# Patient Record
Sex: Female | Born: 1975 | Hispanic: No | Marital: Married | Smoking: Former smoker
Health system: Southern US, Community
[De-identification: ages and names within clinical notes are randomized; demographics above are authoritative.]

## PROBLEM LIST (undated history)

## (undated) DIAGNOSIS — E669 Obesity, unspecified: Secondary | ICD-10-CM

## (undated) DIAGNOSIS — Z9989 Dependence on other enabling machines and devices: Secondary | ICD-10-CM

## (undated) DIAGNOSIS — R7301 Impaired fasting glucose: Secondary | ICD-10-CM

## (undated) DIAGNOSIS — E119 Type 2 diabetes mellitus without complications: Secondary | ICD-10-CM

## (undated) DIAGNOSIS — F419 Anxiety disorder, unspecified: Secondary | ICD-10-CM

## (undated) DIAGNOSIS — E785 Hyperlipidemia, unspecified: Secondary | ICD-10-CM

## (undated) DIAGNOSIS — G4733 Obstructive sleep apnea (adult) (pediatric): Secondary | ICD-10-CM

## (undated) DIAGNOSIS — F329 Major depressive disorder, single episode, unspecified: Secondary | ICD-10-CM

## (undated) DIAGNOSIS — F32A Depression, unspecified: Secondary | ICD-10-CM

## (undated) DIAGNOSIS — G43909 Migraine, unspecified, not intractable, without status migrainosus: Secondary | ICD-10-CM

## (undated) DIAGNOSIS — J302 Other seasonal allergic rhinitis: Secondary | ICD-10-CM

## (undated) HISTORY — DX: Anxiety disorder, unspecified: F41.9

## (undated) HISTORY — DX: Other seasonal allergic rhinitis: J30.2

## (undated) HISTORY — DX: Obesity, unspecified: E66.9

## (undated) HISTORY — DX: Migraine, unspecified, not intractable, without status migrainosus: G43.909

## (undated) HISTORY — DX: Depression, unspecified: F32.A

## (undated) HISTORY — DX: Dependence on other enabling machines and devices: Z99.89

## (undated) HISTORY — DX: Obstructive sleep apnea (adult) (pediatric): G47.33

## (undated) HISTORY — DX: Major depressive disorder, single episode, unspecified: F32.9

## (undated) HISTORY — DX: Hyperlipidemia, unspecified: E78.5

## (undated) HISTORY — DX: Impaired fasting glucose: R73.01

---

## 2004-12-22 ENCOUNTER — Other Ambulatory Visit: Admission: RE | Admit: 2004-12-22 | Discharge: 2004-12-22 | Payer: Self-pay | Admitting: Family Medicine

## 2005-10-02 ENCOUNTER — Other Ambulatory Visit: Admission: RE | Admit: 2005-10-02 | Discharge: 2005-10-02 | Payer: Self-pay | Admitting: Family Medicine

## 2012-05-11 ENCOUNTER — Other Ambulatory Visit (HOSPITAL_COMMUNITY)
Admission: RE | Admit: 2012-05-11 | Discharge: 2012-05-11 | Disposition: A | Discharge status: Home / Self Care | Payer: Self-pay | Source: Ambulatory Visit | Attending: Family Medicine | Admitting: Family Medicine

## 2012-05-11 ENCOUNTER — Other Ambulatory Visit: Payer: Self-pay | Admitting: Family Medicine

## 2012-05-11 DIAGNOSIS — Z1159 Encounter for screening for other viral diseases: Secondary | ICD-10-CM | POA: Insufficient documentation

## 2012-05-11 DIAGNOSIS — Z124 Encounter for screening for malignant neoplasm of cervix: Secondary | ICD-10-CM | POA: Insufficient documentation

## 2014-10-18 ENCOUNTER — Ambulatory Visit: Payer: Self-pay

## 2014-10-23 ENCOUNTER — Other Ambulatory Visit (HOSPITAL_COMMUNITY)
Admission: RE | Admit: 2014-10-23 | Discharge: 2014-10-23 | Disposition: A | Discharge status: Home / Self Care | Payer: BC Managed Care – PPO | Source: Ambulatory Visit | Attending: Family Medicine | Admitting: Family Medicine

## 2014-10-23 ENCOUNTER — Other Ambulatory Visit: Payer: Self-pay | Admitting: Family Medicine

## 2014-10-23 DIAGNOSIS — Z1151 Encounter for screening for human papillomavirus (HPV): Secondary | ICD-10-CM | POA: Insufficient documentation

## 2014-10-23 DIAGNOSIS — Z124 Encounter for screening for malignant neoplasm of cervix: Secondary | ICD-10-CM | POA: Diagnosis not present

## 2014-10-25 ENCOUNTER — Ambulatory Visit: Payer: BC Managed Care – PPO

## 2014-10-25 LAB — CYTOLOGY - PAP

## 2014-11-01 ENCOUNTER — Ambulatory Visit: Payer: BC Managed Care – PPO

## 2015-02-11 ENCOUNTER — Encounter: Payer: Self-pay | Admitting: *Deleted

## 2017-12-15 ENCOUNTER — Other Ambulatory Visit: Payer: Self-pay | Admitting: Family Medicine

## 2017-12-15 ENCOUNTER — Other Ambulatory Visit (HOSPITAL_COMMUNITY)
Admission: RE | Admit: 2017-12-15 | Discharge: 2017-12-15 | Disposition: A | Discharge status: Home / Self Care | Payer: 59 | Source: Ambulatory Visit | Attending: Family Medicine | Admitting: Family Medicine

## 2017-12-15 DIAGNOSIS — Z124 Encounter for screening for malignant neoplasm of cervix: Secondary | ICD-10-CM | POA: Diagnosis not present

## 2017-12-16 LAB — CYTOLOGY - PAP
Diagnosis: NEGATIVE
HPV: NOT DETECTED

## 2019-09-21 ENCOUNTER — Other Ambulatory Visit: Payer: Self-pay

## 2019-09-21 DIAGNOSIS — Z20822 Contact with and (suspected) exposure to covid-19: Secondary | ICD-10-CM

## 2019-09-22 LAB — NOVEL CORONAVIRUS, NAA: SARS-CoV-2, NAA: NOT DETECTED

## 2020-01-23 ENCOUNTER — Other Ambulatory Visit: Payer: Self-pay | Admitting: Family Medicine

## 2020-01-23 DIAGNOSIS — Z1231 Encounter for screening mammogram for malignant neoplasm of breast: Secondary | ICD-10-CM

## 2020-01-25 ENCOUNTER — Other Ambulatory Visit: Payer: Self-pay

## 2020-01-25 ENCOUNTER — Ambulatory Visit
Admission: RE | Admit: 2020-01-25 | Discharge: 2020-01-25 | Disposition: A | Discharge status: Home / Self Care | Payer: 59 | Source: Ambulatory Visit

## 2020-01-25 DIAGNOSIS — Z1231 Encounter for screening mammogram for malignant neoplasm of breast: Secondary | ICD-10-CM

## 2020-02-17 ENCOUNTER — Ambulatory Visit: Payer: 59 | Attending: Internal Medicine

## 2020-02-17 DIAGNOSIS — Z23 Encounter for immunization: Secondary | ICD-10-CM | POA: Insufficient documentation

## 2020-02-17 NOTE — Progress Notes (Signed)
   Covid-19 Vaccination Clinic  Name:  Ana Scott    MRN: [id number] DOB: 1976-10-20  02/17/2020  [name] was observed post Covid-19 immunization for 15 minutes without incident. She was provided with Vaccine Information Sheet and instruction to access the V-Safe system.   [name] was instructed to call 911 with any severe reactions post vaccine: Marland Kitchen Difficulty breathing  . Swelling of face and throat  . A fast heartbeat  . A bad rash all over body  . Dizziness and weakness   Immunizations Administered    Name Date Dose VIS Date Route   Pfizer COVID-19 Vaccine 02/17/2020 12:22 PM 0.3 mL 11/24/2019 Intramuscular   Manufacturer: [employer], [employer]   Lot: [id number]   NDC: [address]-1

## 2020-03-09 ENCOUNTER — Ambulatory Visit: Payer: 59 | Attending: Internal Medicine

## 2020-03-09 DIAGNOSIS — Z23 Encounter for immunization: Secondary | ICD-10-CM

## 2020-03-09 NOTE — Progress Notes (Signed)
   Covid-19 Vaccination Clinic  Name:  Ana Scott    MRN: [id number] DOB: [birth year]  03/09/2020  Ana Scott was observed post Covid-19 immunization for 15 minutes without incident. She was provided with Vaccine Information Sheet and instruction to access the V-Safe system.   Ana Scott was instructed to call 911 with any severe reactions post vaccine: Marland Kitchen Difficulty breathing  . Swelling of face and throat  . A fast heartbeat  . A bad rash all over body  . Dizziness and weakness   Immunizations Administered    Name Date Dose VIS Date Route   Pfizer COVID-19 Vaccine 03/09/2020 12:14 PM 0.3 mL 11/24/2019 Intramuscular   Manufacturer: [employer], [employer]   Lot: [id number]   NDC: [address]-8

## 2021-03-21 ENCOUNTER — Encounter (HOSPITAL_COMMUNITY): Payer: Self-pay | Admitting: Emergency Medicine

## 2021-03-21 ENCOUNTER — Emergency Department (HOSPITAL_COMMUNITY)
Admission: EM | Admit: 2021-03-21 | Discharge: 2021-03-21 | Disposition: A | Discharge status: Home / Self Care | Payer: 59 | Attending: Emergency Medicine | Admitting: Emergency Medicine

## 2021-03-21 ENCOUNTER — Other Ambulatory Visit: Payer: Self-pay

## 2021-03-21 ENCOUNTER — Emergency Department (HOSPITAL_COMMUNITY): Payer: 59

## 2021-03-21 DIAGNOSIS — M542 Cervicalgia: Secondary | ICD-10-CM | POA: Diagnosis not present

## 2021-03-21 DIAGNOSIS — R079 Chest pain, unspecified: Secondary | ICD-10-CM | POA: Diagnosis not present

## 2021-03-21 DIAGNOSIS — Z87891 Personal history of nicotine dependence: Secondary | ICD-10-CM | POA: Insufficient documentation

## 2021-03-21 DIAGNOSIS — E119 Type 2 diabetes mellitus without complications: Secondary | ICD-10-CM | POA: Insufficient documentation

## 2021-03-21 DIAGNOSIS — M79603 Pain in arm, unspecified: Secondary | ICD-10-CM | POA: Diagnosis not present

## 2021-03-21 HISTORY — DX: Type 2 diabetes mellitus without complications: E11.9

## 2021-03-21 LAB — CBC
HCT: 39.1 % (ref 36.0–46.0)
Hemoglobin: 12.3 g/dL (ref 12.0–15.0)
MCH: 26.1 pg (ref 26.0–34.0)
MCHC: 31.5 g/dL (ref 30.0–36.0)
MCV: 82.8 fL (ref 80.0–100.0)
Platelets: 285 10*3/uL (ref 150–400)
RBC: 4.72 MIL/uL (ref 3.87–5.11)
RDW: 15.2 % (ref 11.5–15.5)
WBC: 9.9 10*3/uL (ref 4.0–10.5)
nRBC: 0 % (ref 0.0–0.2)

## 2021-03-21 LAB — BASIC METABOLIC PANEL
Anion gap: 7 (ref 5–15)
BUN: 9 mg/dL (ref 6–20)
CO2: 26 mmol/L (ref 22–32)
Calcium: 9.3 mg/dL (ref 8.9–10.3)
Chloride: 103 mmol/L (ref 98–111)
Creatinine, Ser: 0.66 mg/dL (ref 0.44–1.00)
GFR, Estimated: >60 mL/min (ref >60)
Glucose, Bld: 119 mg/dL — ABNORMAL HIGH (ref 70–99)
Potassium: 4 mmol/L (ref 3.5–5.1)
Sodium: 136 mmol/L (ref 135–145)

## 2021-03-21 LAB — TROPONIN I (HIGH SENSITIVITY): Troponin I (High Sensitivity): <2 ng/L (ref <18)

## 2021-03-21 NOTE — ED Provider Notes (Signed)
[facility] EMERGENCY DEPARTMENT Provider Note   CSN: [id number] Arrival date & time: 03/21/21  1351     History Chief Complaint  Patient presents with  . Chest Pain    Ana Scott is a 45 y.o. female.  HPI Patient presents with chest pain.  Dull left chest.  Has had for 2 and half days now.  Not exertional.  Does have some radiation to the neck to the arm.  Has not had pains like this before.  Is been able to do activity the last 2 days without issue.  Pain is been constant.  Family history of early coronary artery disease but has not been seen herself.  No difficulty breathing.  No swelling in the legs.  Does not smoke.    Past Medical History:  Diagnosis Date  . Abnormal fasting glucose   . Anxiety and depression   . Diabetes mellitus without complication (HCC)   . HLD (hyperlipidemia)   . Migraines    worse when on OCPS  . Obesity   . OSA on CPAP   . Seasonal allergies     There are no problems to display for this patient.   History reviewed. No pertinent surgical history.   OB History   No obstetric history on file.     No family history on file.  Social History   Tobacco Use  . Smoking status: Former Games developer  . Tobacco comment: qut 2013  Substance Use Topics  . Alcohol use: Yes    Alcohol/week: 0.0 standard drinks    Comment: beer/wine 5 glasses a week  . Drug use: No    Home Medications Prior to Admission medications   Not on File    Allergies    Patient has no known allergies.  Review of Systems   Review of Systems  Constitutional: Negative for appetite change.  HENT: Negative for congestion.   Respiratory: Negative for shortness of breath.   Cardiovascular: Positive for chest pain.  Gastrointestinal: Negative for abdominal pain.  Genitourinary: Negative for dysuria.  Musculoskeletal: Negative for back pain.  Skin: Negative for rash.  Neurological: Negative for weakness.  Psychiatric/Behavioral: Negative for  confusion.    Physical Exam Updated Vital Signs BP 122/77   Pulse 73   Temp 98.7 F (37.1 C)   Resp 16   SpO2 98%   Physical Exam Vitals and nursing note reviewed.  HENT:     Head: Atraumatic.  Cardiovascular:     Rate and Rhythm: Normal rate and regular rhythm.  Pulmonary:     Effort: Pulmonary effort is normal.     Breath sounds: No wheezing, rhonchi or rales.  Chest:     Chest wall: No tenderness.  Abdominal:     Tenderness: There is no abdominal tenderness.  Musculoskeletal:     Cervical back: Normal range of motion.     Right lower leg: No edema.     Left lower leg: No edema.  Skin:    Capillary Refill: Capillary refill takes less than 2 seconds.     Findings: No rash.  Neurological:     Mental Status: She is alert and oriented to person, place, and time.     ED Results / Procedures / Treatments   Labs (all labs ordered are listed, but only abnormal results are displayed) Labs Reviewed  BASIC METABOLIC PANEL - Abnormal; Notable for the following components:      Result Value   Glucose, Bld 119 (*)  All other components within normal limits  CBC  TROPONIN I (HIGH SENSITIVITY)  TROPONIN I (HIGH SENSITIVITY)    EKG EKG Interpretation  Date/Time:  Friday March 21 2021 14:07:46 EDT Ventricular Rate:  65 PR Interval:  138 QRS Duration: 80 QT Interval:  440 QTC Calculation: 457 R Axis:   43 Text Interpretation: Normal sinus rhythm Normal ECG Confirmed by Benjiman Core [phone]) on 03/21/2021 2:36:42 PM   Radiology DG Chest 2 View  Result Date: 03/21/2021 CLINICAL DATA:  Chest pain EXAM: CHEST - 2 VIEW COMPARISON:  None. FINDINGS: Lungs are clear. Heart size and pulmonary vascularity are normal. No adenopathy. No pneumothorax. No bone lesions. IMPRESSION: Lungs clear.  Cardiac silhouette normal. Electronically Signed   By: Bretta Bang III M.D.   On: 03/21/2021 15:01    Procedures Procedures   Medications Ordered in ED Medications - No data  to display  ED Course  I have reviewed the triage vital signs and the nursing notes.  Pertinent labs & imaging results that were available during my care of the patient were reviewed by me and considered in my medical decision making (see chart for details).    MDM Rules/Calculators/A&P                          Patient with nonspecific chest pain.  Left chest.  Not exertional.  EKG reassuring.  Troponin negative.  Has had pain for a couple days now.  I think patient is low enough risk that she can be followed as an outpatient.  Doubt pulmonary embolism.  Doubt pneumonia.  Will give patient cardiology to follow-up with.  Discharge home Final Clinical Impression(s) / ED Diagnoses Final diagnoses:  Nonspecific chest pain    Rx / DC Orders ED Discharge Orders    None       Benjiman Core, MD 03/21/21 1554

## 2021-03-21 NOTE — ED Triage Notes (Signed)
Patient complains of persistent chest pain in the left chest, arm, and jaw that started 2.5 days ago. Pain is described as a dull ache that does not change. Patient alert, oriented, and in no apparent distress at this time.

## 2022-05-27 ENCOUNTER — Other Ambulatory Visit: Payer: Self-pay | Admitting: Family Medicine

## 2022-05-27 ENCOUNTER — Other Ambulatory Visit (HOSPITAL_COMMUNITY): Payer: Self-pay | Admitting: Family Medicine

## 2022-05-27 DIAGNOSIS — E785 Hyperlipidemia, unspecified: Secondary | ICD-10-CM

## 2022-06-05 ENCOUNTER — Telehealth: Payer: Self-pay | Admitting: Family Medicine

## 2022-11-06 IMAGING — CR DG CHEST 2V
2 series · 2 of 2 positions shown · non-contrast
Comparison: None.

CLINICAL DATA: Chest pain

EXAM:
CHEST - 2 VIEW

[chest pa]
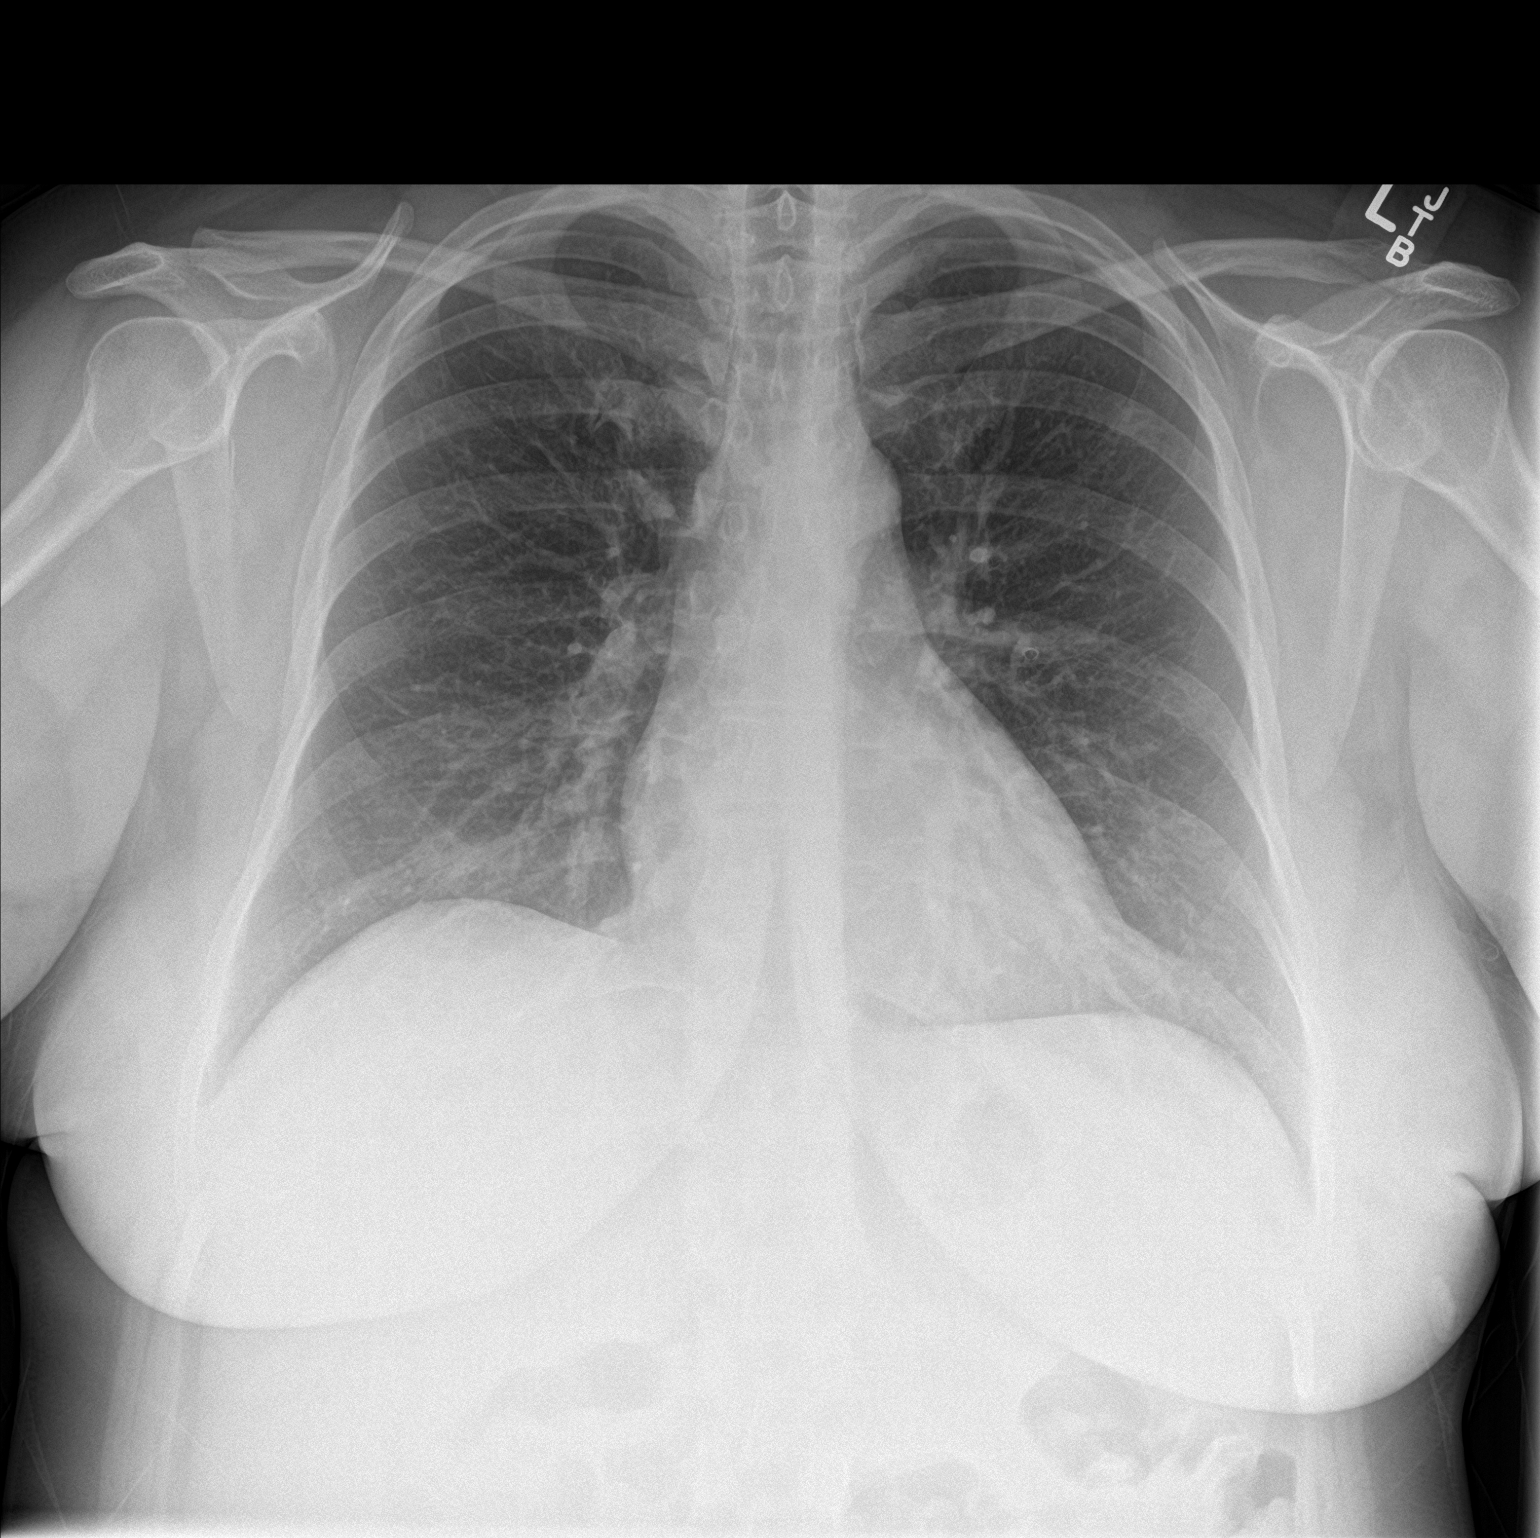

[chest lat]
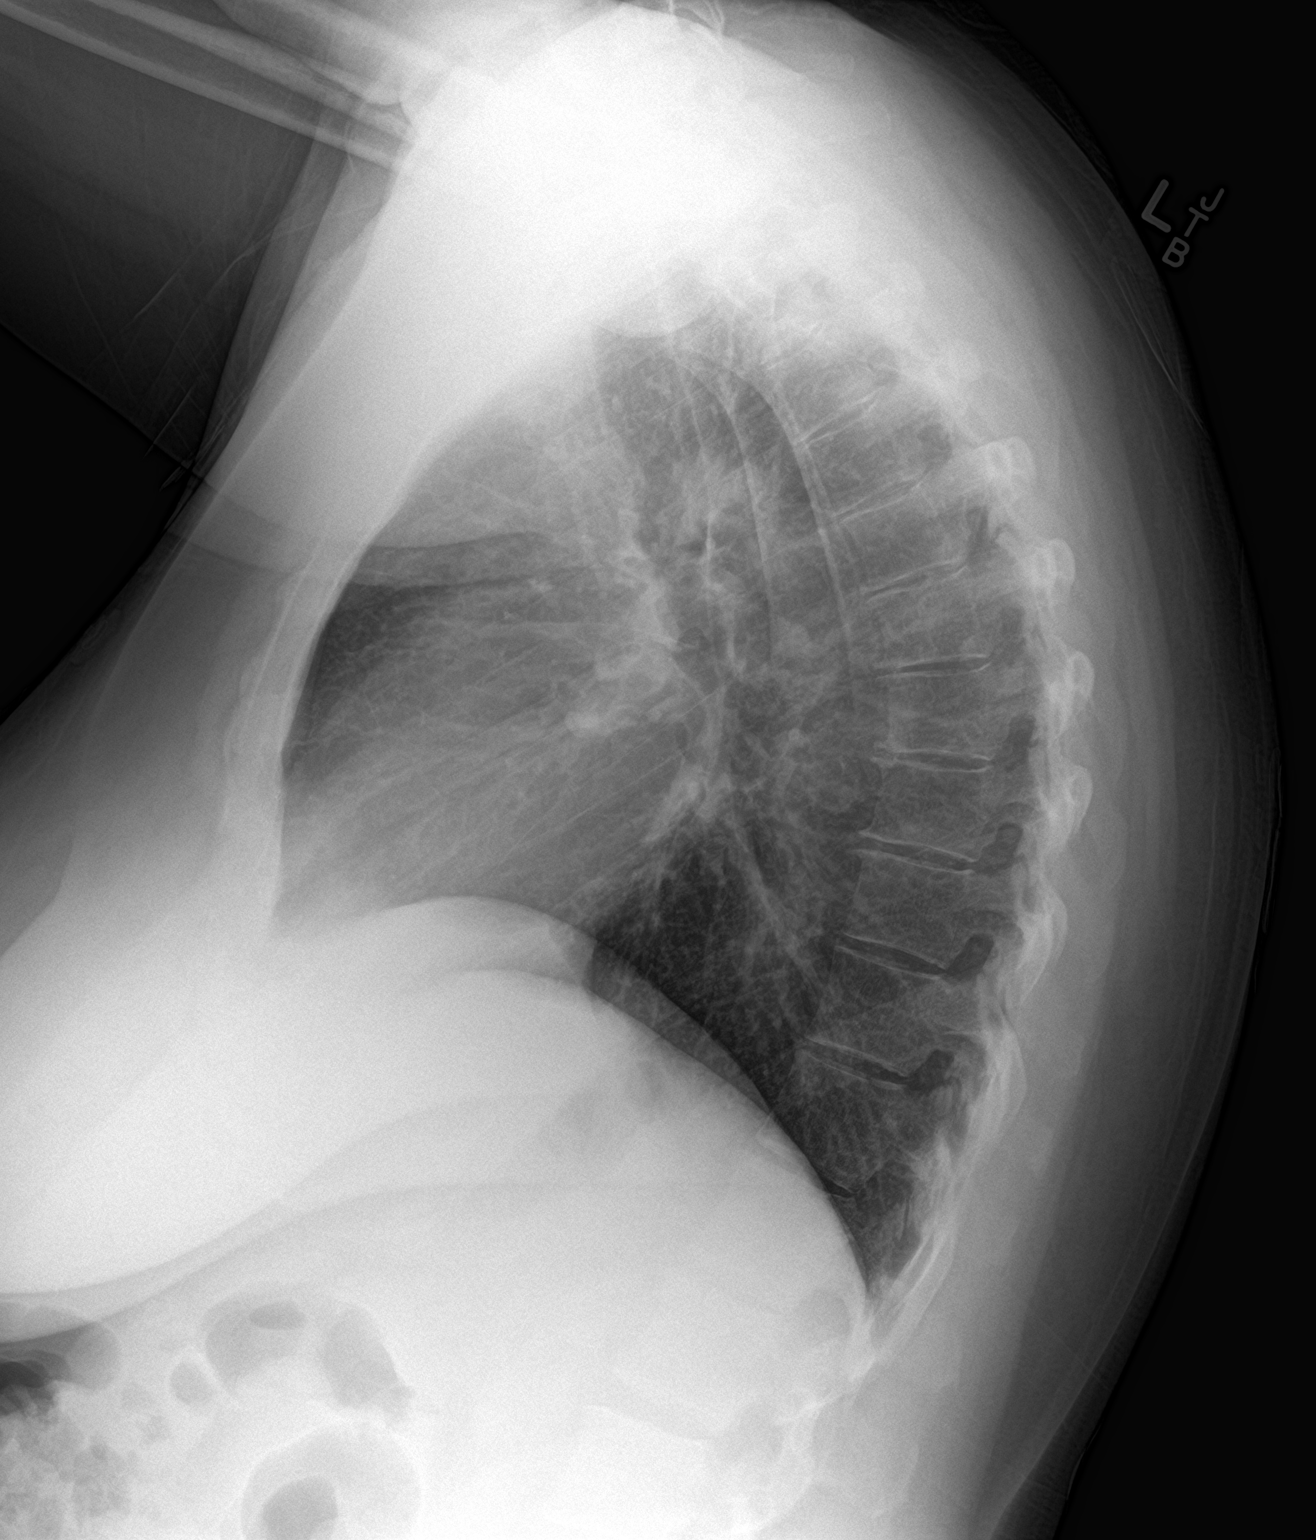

[2 of 2 positions shown; findings below may reference images not displayed]

FINDINGS: Lungs are clear. Heart size and pulmonary vascularity are normal. No
adenopathy. No pneumothorax. No bone lesions.
IMPRESSION: Lungs clear.  Cardiac silhouette normal.

## 2022-11-30 ENCOUNTER — Other Ambulatory Visit (HOSPITAL_COMMUNITY): Payer: Self-pay | Admitting: Family Medicine

## 2022-11-30 DIAGNOSIS — E785 Hyperlipidemia, unspecified: Secondary | ICD-10-CM

## 2022-12-25 ENCOUNTER — Ambulatory Visit (HOSPITAL_COMMUNITY)
Admission: RE | Admit: 2022-12-25 | Discharge: 2022-12-25 | Disposition: A | Discharge status: Home / Self Care | Payer: 59 | Source: Ambulatory Visit | Attending: Family Medicine | Admitting: Family Medicine

## 2022-12-25 DIAGNOSIS — E785 Hyperlipidemia, unspecified: Secondary | ICD-10-CM | POA: Insufficient documentation

## 2023-05-17 ENCOUNTER — Other Ambulatory Visit: Payer: Self-pay | Admitting: Family Medicine

## 2023-05-17 DIAGNOSIS — Z1231 Encounter for screening mammogram for malignant neoplasm of breast: Secondary | ICD-10-CM

## 2023-06-03 ENCOUNTER — Ambulatory Visit
Admission: RE | Admit: 2023-06-03 | Discharge: 2023-06-03 | Disposition: A | Discharge status: Home / Self Care | Payer: 59 | Source: Ambulatory Visit | Attending: Family Medicine | Admitting: Family Medicine

## 2023-06-03 DIAGNOSIS — Z1231 Encounter for screening mammogram for malignant neoplasm of breast: Secondary | ICD-10-CM

## 2024-02-15 DIAGNOSIS — F3181 Bipolar II disorder: Secondary | ICD-10-CM | POA: Diagnosis not present

## 2024-02-15 DIAGNOSIS — F4312 Post-traumatic stress disorder, chronic: Secondary | ICD-10-CM | POA: Diagnosis not present

## 2024-02-21 DIAGNOSIS — F39 Unspecified mood [affective] disorder: Secondary | ICD-10-CM | POA: Diagnosis not present

## 2024-02-29 DIAGNOSIS — F39 Unspecified mood [affective] disorder: Secondary | ICD-10-CM | POA: Diagnosis not present

## 2024-03-03 DIAGNOSIS — F4310 Post-traumatic stress disorder, unspecified: Secondary | ICD-10-CM | POA: Diagnosis not present

## 2024-03-03 DIAGNOSIS — F332 Major depressive disorder, recurrent severe without psychotic features: Secondary | ICD-10-CM | POA: Diagnosis not present

## 2024-03-13 ENCOUNTER — Other Ambulatory Visit: Payer: Self-pay | Admitting: Family Medicine

## 2024-03-13 ENCOUNTER — Other Ambulatory Visit (HOSPITAL_COMMUNITY)
Admission: RE | Admit: 2024-03-13 | Discharge: 2024-03-13 | Disposition: A | Discharge status: Home / Self Care | Source: Ambulatory Visit | Attending: Family Medicine | Admitting: Family Medicine

## 2024-03-13 DIAGNOSIS — Z124 Encounter for screening for malignant neoplasm of cervix: Secondary | ICD-10-CM | POA: Diagnosis not present

## 2024-03-13 DIAGNOSIS — Z113 Encounter for screening for infections with a predominantly sexual mode of transmission: Secondary | ICD-10-CM | POA: Diagnosis not present

## 2024-03-13 DIAGNOSIS — Z01411 Encounter for gynecological examination (general) (routine) with abnormal findings: Secondary | ICD-10-CM | POA: Insufficient documentation

## 2024-03-13 DIAGNOSIS — F39 Unspecified mood [affective] disorder: Secondary | ICD-10-CM | POA: Diagnosis not present

## 2024-03-16 LAB — CYTOLOGY - PAP
Adequacy: ABSENT
Comment: NEGATIVE
Diagnosis: NEGATIVE
High risk HPV: NEGATIVE

## 2024-03-20 DIAGNOSIS — F39 Unspecified mood [affective] disorder: Secondary | ICD-10-CM | POA: Diagnosis not present

## 2024-03-24 DIAGNOSIS — F4312 Post-traumatic stress disorder, chronic: Secondary | ICD-10-CM | POA: Diagnosis not present

## 2024-03-29 DIAGNOSIS — F39 Unspecified mood [affective] disorder: Secondary | ICD-10-CM | POA: Diagnosis not present

## 2024-03-31 DIAGNOSIS — F4312 Post-traumatic stress disorder, chronic: Secondary | ICD-10-CM | POA: Diagnosis not present

## 2024-04-03 DIAGNOSIS — Z30430 Encounter for insertion of intrauterine contraceptive device: Secondary | ICD-10-CM | POA: Diagnosis not present

## 2024-04-03 DIAGNOSIS — Z3202 Encounter for pregnancy test, result negative: Secondary | ICD-10-CM | POA: Diagnosis not present

## 2024-04-04 DIAGNOSIS — F39 Unspecified mood [affective] disorder: Secondary | ICD-10-CM | POA: Diagnosis not present

## 2024-04-07 DIAGNOSIS — F4312 Post-traumatic stress disorder, chronic: Secondary | ICD-10-CM | POA: Diagnosis not present

## 2024-04-21 DIAGNOSIS — F4312 Post-traumatic stress disorder, chronic: Secondary | ICD-10-CM | POA: Diagnosis not present

## 2024-04-28 DIAGNOSIS — F4312 Post-traumatic stress disorder, chronic: Secondary | ICD-10-CM | POA: Diagnosis not present

## 2024-05-12 DIAGNOSIS — F4312 Post-traumatic stress disorder, chronic: Secondary | ICD-10-CM | POA: Diagnosis not present

## 2024-05-15 DIAGNOSIS — F4312 Post-traumatic stress disorder, chronic: Secondary | ICD-10-CM | POA: Diagnosis not present

## 2024-05-19 DIAGNOSIS — Z30431 Encounter for routine checking of intrauterine contraceptive device: Secondary | ICD-10-CM | POA: Diagnosis not present

## 2024-05-19 DIAGNOSIS — F4312 Post-traumatic stress disorder, chronic: Secondary | ICD-10-CM | POA: Diagnosis not present

## 2024-05-26 DIAGNOSIS — F4312 Post-traumatic stress disorder, chronic: Secondary | ICD-10-CM | POA: Diagnosis not present

## 2024-05-29 DIAGNOSIS — F4312 Post-traumatic stress disorder, chronic: Secondary | ICD-10-CM | POA: Diagnosis not present

## 2024-05-30 DIAGNOSIS — F4312 Post-traumatic stress disorder, chronic: Secondary | ICD-10-CM | POA: Diagnosis not present

## 2024-06-02 DIAGNOSIS — F4312 Post-traumatic stress disorder, chronic: Secondary | ICD-10-CM | POA: Diagnosis not present

## 2024-06-09 DIAGNOSIS — F4312 Post-traumatic stress disorder, chronic: Secondary | ICD-10-CM | POA: Diagnosis not present

## 2024-06-23 DIAGNOSIS — F4312 Post-traumatic stress disorder, chronic: Secondary | ICD-10-CM | POA: Diagnosis not present

## 2024-07-03 DIAGNOSIS — F4312 Post-traumatic stress disorder, chronic: Secondary | ICD-10-CM | POA: Diagnosis not present

## 2024-07-14 DIAGNOSIS — F4312 Post-traumatic stress disorder, chronic: Secondary | ICD-10-CM | POA: Diagnosis not present

## 2024-07-21 DIAGNOSIS — F4312 Post-traumatic stress disorder, chronic: Secondary | ICD-10-CM | POA: Diagnosis not present

## 2024-07-21 DIAGNOSIS — F3181 Bipolar II disorder: Secondary | ICD-10-CM | POA: Diagnosis not present

## 2024-07-28 DIAGNOSIS — F4312 Post-traumatic stress disorder, chronic: Secondary | ICD-10-CM | POA: Diagnosis not present

## 2024-08-11 DIAGNOSIS — F4312 Post-traumatic stress disorder, chronic: Secondary | ICD-10-CM | POA: Diagnosis not present

## 2024-08-18 DIAGNOSIS — F4312 Post-traumatic stress disorder, chronic: Secondary | ICD-10-CM | POA: Diagnosis not present

## 2024-08-25 DIAGNOSIS — F4312 Post-traumatic stress disorder, chronic: Secondary | ICD-10-CM | POA: Diagnosis not present

## 2024-09-08 DIAGNOSIS — F3181 Bipolar II disorder: Secondary | ICD-10-CM | POA: Diagnosis not present

## 2024-09-08 DIAGNOSIS — F4312 Post-traumatic stress disorder, chronic: Secondary | ICD-10-CM | POA: Diagnosis not present

## 2024-09-22 DIAGNOSIS — F4312 Post-traumatic stress disorder, chronic: Secondary | ICD-10-CM | POA: Diagnosis not present

## 2024-10-13 DIAGNOSIS — F4312 Post-traumatic stress disorder, chronic: Secondary | ICD-10-CM | POA: Diagnosis not present

## 2024-11-03 DIAGNOSIS — F4312 Post-traumatic stress disorder, chronic: Secondary | ICD-10-CM | POA: Diagnosis not present

## 2024-11-16 DIAGNOSIS — F4312 Post-traumatic stress disorder, chronic: Secondary | ICD-10-CM | POA: Diagnosis not present

## 2024-11-17 DIAGNOSIS — F4312 Post-traumatic stress disorder, chronic: Secondary | ICD-10-CM | POA: Diagnosis not present

## 2024-11-23 DIAGNOSIS — F4312 Post-traumatic stress disorder, chronic: Secondary | ICD-10-CM | POA: Diagnosis not present
